# Patient Record
Sex: Male | Born: 1945 | Race: White | Hispanic: No | Marital: Single
Health system: Southern US, Community
[De-identification: ages and names within clinical notes are randomized; demographics above are authoritative.]

---

## 2016-03-09 ENCOUNTER — Other Ambulatory Visit: Payer: Self-pay | Admitting: Family Medicine

## 2016-03-09 DIAGNOSIS — R748 Abnormal levels of other serum enzymes: Secondary | ICD-10-CM

## 2016-03-17 ENCOUNTER — Ambulatory Visit
Admission: RE | Admit: 2016-03-17 | Discharge: 2016-03-17 | Disposition: A | Discharge status: Home / Self Care | Payer: Medicare HMO | Source: Ambulatory Visit | Attending: Family Medicine | Admitting: Family Medicine

## 2016-03-17 DIAGNOSIS — R748 Abnormal levels of other serum enzymes: Secondary | ICD-10-CM | POA: Insufficient documentation

## 2016-03-17 DIAGNOSIS — K760 Fatty (change of) liver, not elsewhere classified: Secondary | ICD-10-CM | POA: Insufficient documentation

## 2018-06-04 ENCOUNTER — Other Ambulatory Visit (HOSPITAL_COMMUNITY): Payer: Self-pay | Admitting: Family Medicine

## 2018-06-04 ENCOUNTER — Other Ambulatory Visit: Payer: Self-pay | Admitting: Family Medicine

## 2018-06-04 DIAGNOSIS — R140 Abdominal distension (gaseous): Secondary | ICD-10-CM

## 2018-06-04 DIAGNOSIS — R935 Abnormal findings on diagnostic imaging of other abdominal regions, including retroperitoneum: Secondary | ICD-10-CM

## 2018-06-06 ENCOUNTER — Other Ambulatory Visit: Payer: Self-pay

## 2018-06-06 ENCOUNTER — Ambulatory Visit
Admission: RE | Admit: 2018-06-06 | Discharge: 2018-06-06 | Disposition: A | Discharge status: Home / Self Care | Payer: Medicare HMO | Source: Ambulatory Visit | Attending: Family Medicine | Admitting: Family Medicine

## 2018-06-06 DIAGNOSIS — R140 Abdominal distension (gaseous): Secondary | ICD-10-CM

## 2018-06-06 DIAGNOSIS — R935 Abnormal findings on diagnostic imaging of other abdominal regions, including retroperitoneum: Secondary | ICD-10-CM | POA: Diagnosis present

## 2018-06-06 LAB — POCT I-STAT CREATININE: CREATININE: 1.3 mg/dL — AB (ref 0.61–1.24)

## 2018-06-06 MED ORDER — IOHEXOL 300 MG/ML  SOLN
100.0000 mL | Freq: Once | INTRAMUSCULAR | Status: AC | PRN
  Administered 2018-06-06: 100 mL via INTRAVENOUS

## 2019-08-19 ENCOUNTER — Other Ambulatory Visit: Payer: Self-pay

## 2019-08-19 ENCOUNTER — Ambulatory Visit: Payer: Medicare HMO | Admitting: Dermatology

## 2019-08-19 DIAGNOSIS — L237 Allergic contact dermatitis due to plants, except food: Secondary | ICD-10-CM | POA: Diagnosis not present

## 2019-08-19 MED ORDER — PREDNISONE 5 MG PO TABS: ORAL_TABLET | ORAL | 0 refills | Status: AC

## 2019-08-19 MED ORDER — CLOBETASOL PROPIONATE 0.05 % EX CREA: TOPICAL_CREAM | CUTANEOUS | 1 refills | Status: AC

## 2019-08-19 NOTE — Progress Notes (Signed)
   New Patient Visit  Subjective  Zachary Lane is a 74 y.o. male who presents for the following: Rash (arms and abdomen). Patient was working in the yard 6 days ago cutting vines and trees. Rash came up a few days ago. He is using several OTC anti-itch creams, which give him some relief. Rash is itchy and painful.   The following portions of the chart were reviewed this encounter and updated as appropriate:      Review of Systems:  No other skin or systemic complaints except as noted in HPI or Assessment and Plan.  Objective  Well appearing patient in no apparent distress; mood and affect are within normal limits.  A focused examination was performed including face, arms, abdomen. Relevant physical exam findings are noted in the Assessment and Plan.  Objective  Arms, abdomen: Scaly erythematous papules and plaques +/- edema and vesiculation.    Assessment & Plan  Allergic contact dermatitis due to plants, except food Arms, abdomen  Take prednisone by mouth daily in morning with food starting at 60 mg dosage and gradually decreasing daily dose as directed until gone for a three week taper.  Patient was given written instructions.  Potential side effects reviewed.  Risks of prednisone taper discussed including mood irritability, insomnia, weight gain, stomach ulcers, increased risk of infection, increased blood sugar (diabetes), hypertension, osteoporosis with long-term or frequent use, and rare risk of avascular necrosis of the hip.   OTC Domeboro's compresses/soaks bid Clobetasol cream bid to aas until rash cleared, avoid f/g/a   predniSONE (DELTASONE) 5 MG tablet - Arms, abdomen  clobetasol cream (TEMOVATE) 0.05 % - Arms, abdomen  Return if symptoms worsen or fail to improve.  Zachary Lane, CMA, am acting as scribe for Zachary Niece, MD .  Documentation: I have reviewed the above documentation for accuracy and completeness, and I agree with the above.  Zachary Niece  MD

## 2019-08-19 NOTE — Patient Instructions (Addendum)
3 Week Prednisone Taper  You will be given a prescription for 150 tablets of oral Prednisone. It is very important that you take this according to the exact schedule provided below. This type of regimen for taking medication is often called a "taper", because your dosage will steadily decrease over a two week period until it is discontinued altogether.  ALWAYS take this medicine with food to prevent it from irritating your stomach. You should also take your Prednisone during morning hours.  Call the clinic at [phone] if you gain more than two pounds in one day, notice swelling anywhere on your body, have shortness of breath, black or red bowel movements, brown or red vomitus, desire to drink large amounts of fluids, a fever, or extreme weakness.   Oral Prednisone over Three Weeks  Day  Week 1  Week 2  Week 3   1  12  tablets  9 tablets  5 tablets   2  12 tablets  8 tablets  5 tablets   3  11 tablets  8 tablets  4 tablets   4  11 tablets  7 tablets  4 tablets   5  10 tablets  7 tablets  3 tablets   6  10 tablets  6 tablets  2 tablets   7  9 tablets  6 tablets  1 tablet    Risks of prednisone taper discussed including mood irritability, insomnia, weight gain, stomach ulcers, increased risk of infection, increased blood sugar (diabetes), hypertension, osteoporosis with long-term or frequent use, and rare risk of avascular necrosis of the hip.    Start Domeboro Soaks - Use as a compress or a soak to affected areas twice daily. Leave on 15 minutes or longer.  Start clobetasol cream - Apply to affected areas of rash on abdomen and arms once or twice daily until improved. Avoid face, groin, underarms.

## 2020-07-09 IMAGING — CT CT ABDOMEN AND PELVIS WITH CONTRAST
2 of 5 series · 17 of 46 positions shown, 19 images · IV contrast (omnipaque)
Comparison: None

CLINICAL DATA: Cough, upper abdominal pain, abdominal distension
for 3 months

EXAM:
CT ABDOMEN AND PELVIS WITH CONTRAST
TECHNIQUE: Multidetector CT imaging of the abdomen and pelvis was performed
using the standard protocol following bolus administration of
intravenous contrast. Sagittal and coronal MPR images reconstructed
from axial data set.
CONTRAST:  100mL OMNIPAQUE IOHEXOL 300 MG/ML SOLN IV. Dilute oral
contrast.

[Series 2: abd pelvis · axial · 0.92mm/px · z∈[-1701,-1281]mm · 14 of 97 slices shown, 16 images (1 of 2)]
[im 7/97  soft-tissue]
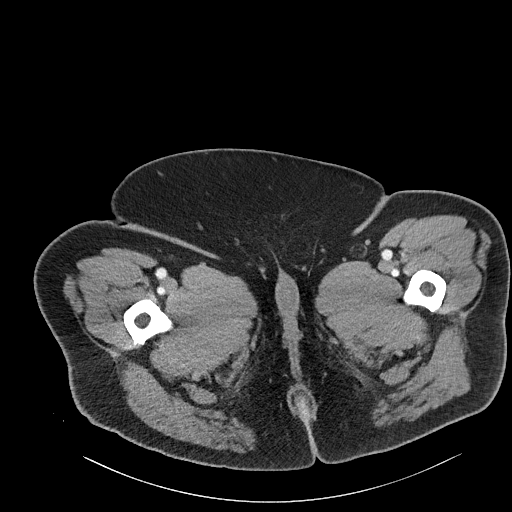
[im 7/97  bone]
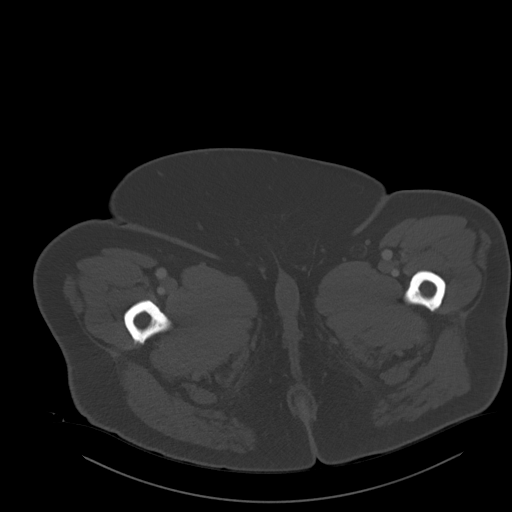
[im 13/97  soft-tissue]
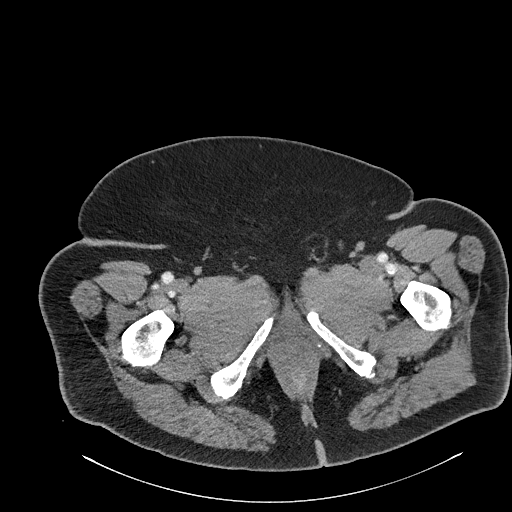
[im 19/97  soft-tissue]
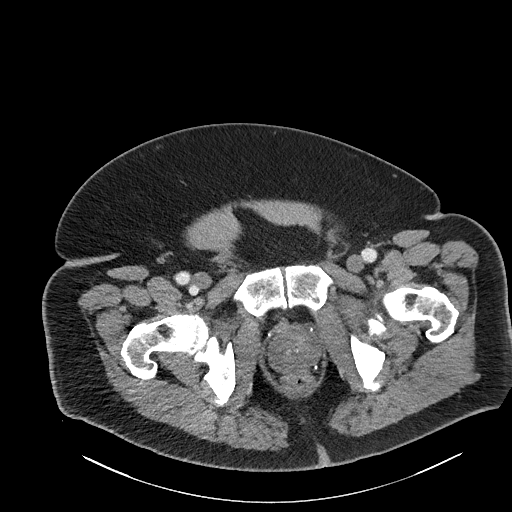
[im 25/97  soft-tissue]
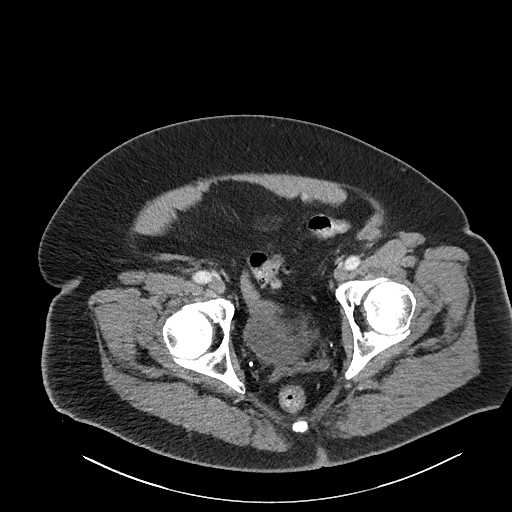
[im 31/97  soft-tissue]
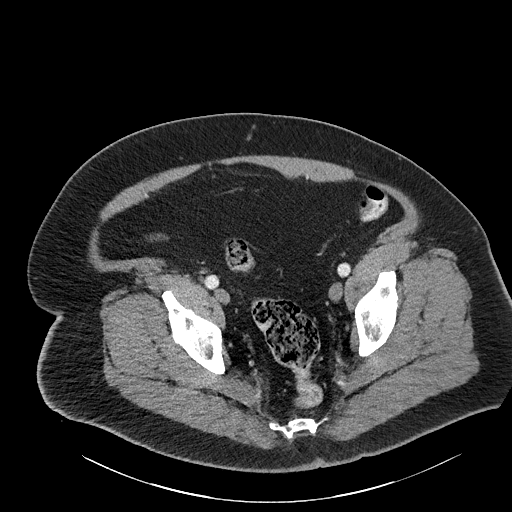
[im 37/97  soft-tissue]
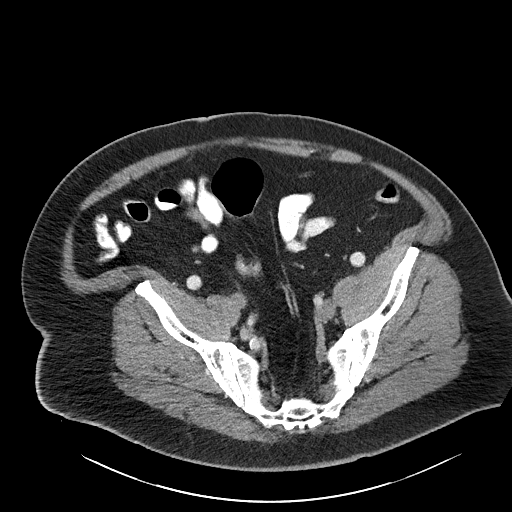
[im 43/97  soft-tissue]
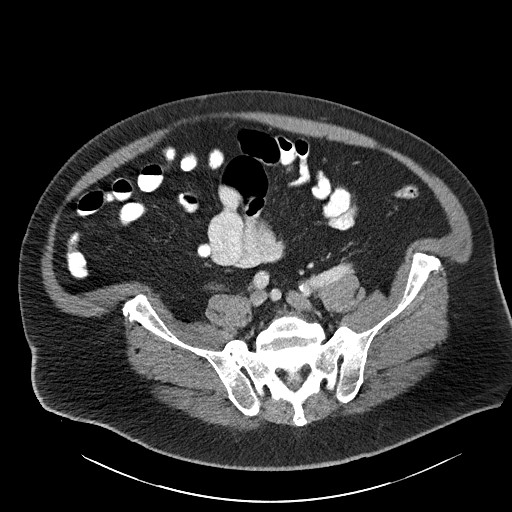
[im 55/97  soft-tissue]
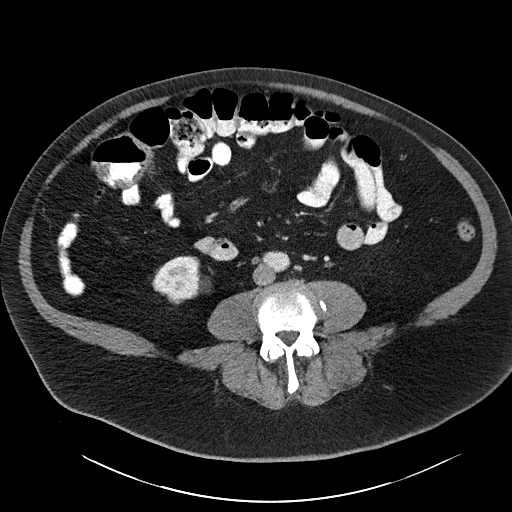
[im 61/97  soft-tissue]
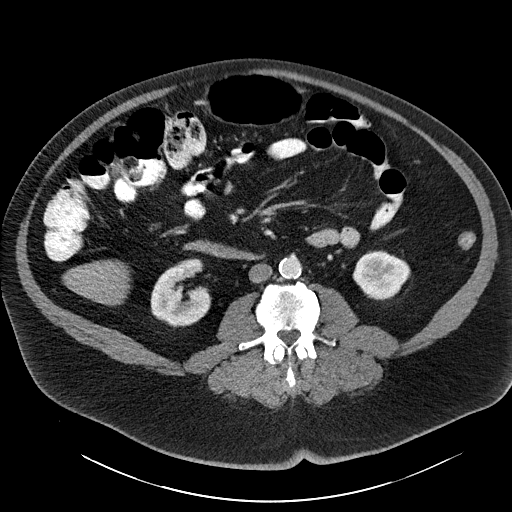
[im 61/97  bone]
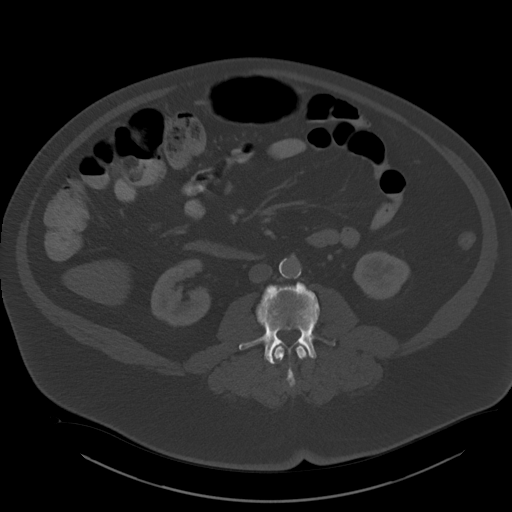
[im 67/97  soft-tissue]
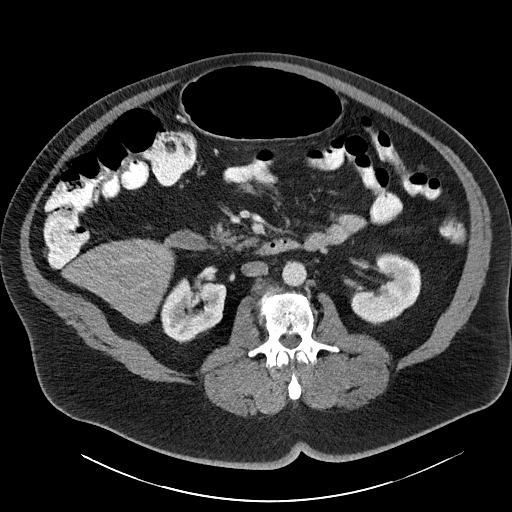
[im 73/97  soft-tissue]
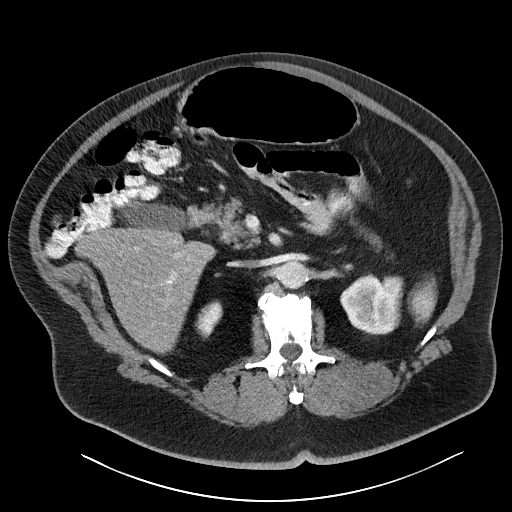
[im 79/97  soft-tissue]
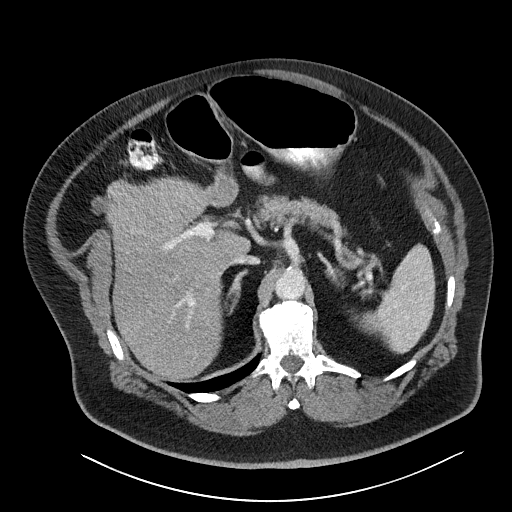
[im 85/97  soft-tissue]
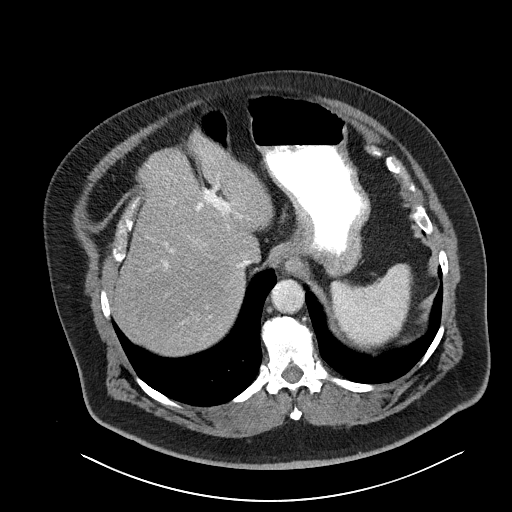
[im 91/97  soft-tissue]
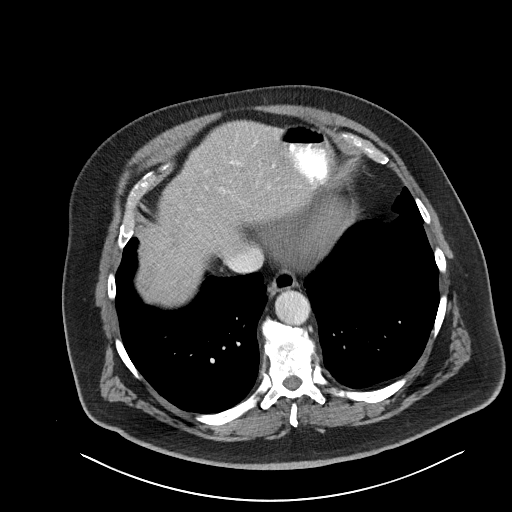

[Series 4: abd pelvis · coronal · 0.92mm/px · 3 of 199 slices shown (2 of 2)]
[im 67/199  soft-tissue]
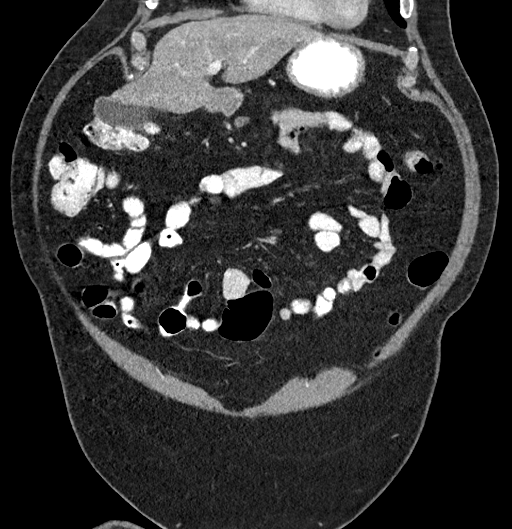
[im 89/199  soft-tissue]
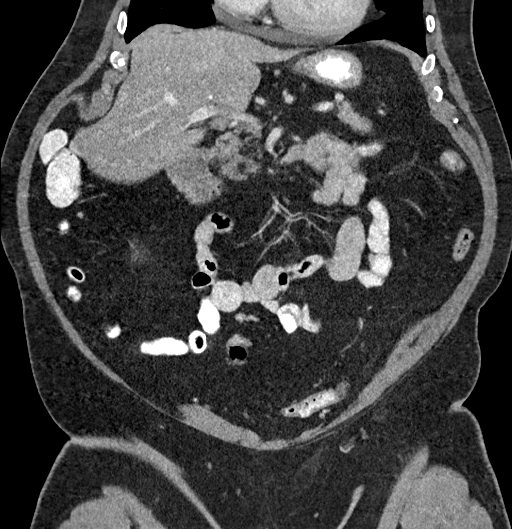
[im 111/199  soft-tissue]
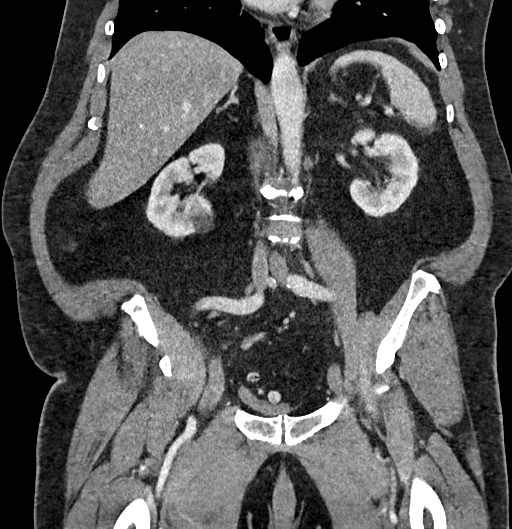

[17 of 46 positions shown; findings below may reference images not displayed]

FINDINGS: Lower chest: Lung bases clear

Hepatobiliary: Fatty infiltration of liver. Liver and gallbladder
otherwise normal appearance.

Pancreas: Normal appearance

Spleen: Normal appearance

Adrenals/Urinary Tract: Adrenal glands normal appearance. Small
RIGHT renal cyst 2.5 x 2.4 cm. Kidneys, ureters, and bladder
otherwise normal appearance

Stomach/Bowel: Sigmoid diverticulosis without evidence of
diverticulitis. Normal appendix. Stomach and bowel loops otherwise
normal appearance

Vascular/Lymphatic: Scattered pelvic phleboliths. Atherosclerotic
calcifications aorta and iliac arteries without aneurysm. No
adenopathy.

Reproductive: Prostate gland 5.1 x 4.3 cm. Seminal vesicles
unremarkable.

Other: Ovoid ring like density with central fat within the anterior
pelvis image 71 measuring 3.9 x 2.1 cm consistent with fat necrosis.
No free air or free fluid. Small LEFT inguinal hernia containing
fat. No acute inflammatory process.

Musculoskeletal: Scattered mild degenerative disc disease changes of
the thoracolumbar spine.
IMPRESSION: Minimal sigmoid diverticulosis without evidence of diverticulitis.

Fatty infiltration of liver.

Prostatic enlargement.

Focus of fat necrosis within fat of the anterior pelvis.

LEFT inguinal hernia containing fat.

No acute intra-abdominal or intrapelvic abnormalities.
